# Patient Record
Sex: Male | Born: 1960 | Race: Black or African American | Hispanic: No | Marital: Single | Smoking: Never smoker
Health system: Southern US, Community
[De-identification: ages and names within clinical notes are randomized; demographics above are authoritative.]

## PROBLEM LIST (undated history)

## (undated) DIAGNOSIS — J45909 Unspecified asthma, uncomplicated: Secondary | ICD-10-CM

---

## 2017-10-02 ENCOUNTER — Emergency Department (HOSPITAL_COMMUNITY): Payer: Self-pay

## 2017-10-02 ENCOUNTER — Emergency Department (HOSPITAL_COMMUNITY)
Admission: EM | Admit: 2017-10-02 | Discharge: 2017-10-02 | Disposition: A | Discharge status: Home / Self Care | Payer: Self-pay | Attending: Emergency Medicine | Admitting: Emergency Medicine

## 2017-10-02 ENCOUNTER — Encounter (HOSPITAL_COMMUNITY): Payer: Self-pay

## 2017-10-02 DIAGNOSIS — R0789 Other chest pain: Secondary | ICD-10-CM | POA: Insufficient documentation

## 2017-10-02 DIAGNOSIS — R072 Precordial pain: Secondary | ICD-10-CM | POA: Insufficient documentation

## 2017-10-02 LAB — I-STAT TROPONIN, ED
TROPONIN I, POC: 0.01 ng/mL (ref 0.00–0.08)
Troponin i, poc: 0.01 ng/mL (ref 0.00–0.08)

## 2017-10-02 LAB — CBC
HEMATOCRIT: 44.7 % (ref 39.0–52.0)
HEMOGLOBIN: 15 g/dL (ref 13.0–17.0)
MCH: 29.2 pg (ref 26.0–34.0)
MCHC: 33.6 g/dL (ref 30.0–36.0)
MCV: 87 fL (ref 78.0–100.0)
Platelets: 287 10*3/uL (ref 150–400)
RBC: 5.14 MIL/uL (ref 4.22–5.81)
RDW: 13.6 % (ref 11.5–15.5)
WBC: 5.3 10*3/uL (ref 4.0–10.5)

## 2017-10-02 LAB — BASIC METABOLIC PANEL
ANION GAP: 8 (ref 5–15)
BUN: 16 mg/dL (ref 6–20)
CALCIUM: 9.6 mg/dL (ref 8.9–10.3)
CO2: 26 mmol/L (ref 22–32)
Chloride: 102 mmol/L (ref 101–111)
Creatinine, Ser: 1.1 mg/dL (ref 0.61–1.24)
GLUCOSE: 105 mg/dL — AB (ref 65–99)
POTASSIUM: 4.7 mmol/L (ref 3.5–5.1)
Sodium: 136 mmol/L (ref 135–145)

## 2017-10-02 MED ORDER — ACETAMINOPHEN 500 MG PO TABS
1000.0000 mg | ORAL_TABLET | Freq: Once | ORAL | Status: AC
  Administered 2017-10-02: 1000 mg via ORAL
  Filled 2017-10-02: qty 2

## 2017-10-02 NOTE — ED Provider Notes (Signed)
[facility]-EMERGENCY DEPT Provider Note   CSN: [id number] Arrival date & time: 10/02/17  4098     History   Chief Complaint Chief Complaint  Patient presents with  . Chest Pain    HPI Hector Black is a 57 y.o. male.  Patient c/o left chest pain since yesterday. States he bent over, and had sudden sharp left chest pain. Pain persistent since. Mild, non radiating. No specific exacerbating or alleviating factors. Denies exertional cp or discomfort. No current or recent cough or uri symptoms. No recent fevers. Denies leg pain or swelling. Denies fam hx premature cad. Denies hx pe or dvt.  No pleuritic pain. No sob. No associated nv or diaphoresis.   The history is provided by the patient.  Chest Pain   Pertinent negatives include no abdominal pain, no fever, no headaches and no shortness of breath.    History reviewed. No pertinent past medical history.  There are no active problems to display for this patient.   History reviewed. No pertinent surgical history.      Home Medications    Prior to Admission medications   Medication Sig Start Date End Date Taking? Authorizing Provider  albuterol (PROVENTIL HFA;VENTOLIN HFA) 108 (90 Base) MCG/ACT inhaler Inhale 2 puffs into the lungs every 6 (six) hours as needed for wheezing or shortness of breath.   Yes [provider]    Family History History reviewed. No pertinent family history.  Social History Social History   Tobacco Use  . Smoking status: Never Smoker  . Smokeless tobacco: Never Used  Substance Use Topics  . Alcohol use: Never    Frequency: Never  . Drug use: Never     Allergies   Patient has no known allergies.   Review of Systems Review of Systems  Constitutional: Negative for fever.  HENT: Negative for sore throat.   Eyes: Negative for redness.  Respiratory: Negative for shortness of breath.   Cardiovascular: Positive for chest pain. Negative for leg swelling.    Gastrointestinal: Negative for abdominal pain.  Genitourinary: Negative for flank pain.  Musculoskeletal: Negative for neck pain.  Skin: Negative for rash.  Neurological: Negative for headaches.  Hematological: Does not bruise/bleed easily.  Psychiatric/Behavioral: Negative for confusion.     Physical Exam Updated Vital Signs BP (!) 141/91 (BP Location: Right Arm)   Pulse 89   Temp 98 F (36.7 C) (Oral)   Resp 12   SpO2 100%   Physical Exam  Constitutional: He appears well-developed and well-nourished. No distress.  HENT:  Mouth/Throat: Oropharynx is clear and moist.  Eyes: Conjunctivae are normal.  Neck: Neck supple. No tracheal deviation present.  Cardiovascular: Normal rate, regular rhythm, normal heart sounds and intact distal pulses. Exam reveals no gallop and no friction rub.  No murmur heard. Pulmonary/Chest: Effort normal and breath sounds normal. No accessory muscle usage. No respiratory distress. He exhibits tenderness.  Abdominal: Soft. Bowel sounds are normal. He exhibits no distension. There is no tenderness.  Musculoskeletal: He exhibits no edema or tenderness.  Neurological: He is alert.  Skin: Skin is warm and dry. No rash noted. He is not diaphoretic.  Psychiatric: He has a normal mood and affect.  Nursing note and vitals reviewed.    ED Treatments / Results  Labs (all labs ordered are listed, but only abnormal results are displayed) Results for orders placed or performed during the hospital encounter of 10/02/17  Basic metabolic panel  Result Value Ref Range   Sodium  136 135 - 145 mmol/L   Potassium 4.7 3.5 - 5.1 mmol/L   Chloride 102 101 - 111 mmol/L   CO2 26 22 - 32 mmol/L   Glucose, Bld 105 (H) 65 - 99 mg/dL   BUN 16 6 - 20 mg/dL   Creatinine, Ser 1.61 0.61 - 1.24 mg/dL   Calcium 9.6 8.9 - 09.6 mg/dL   GFR calc non Af Amer >60 >60 mL/min   GFR calc Af Amer >60 >60 mL/min   Anion gap 8 5 - 15  CBC  Result Value Ref Range   WBC 5.3 4.0 -  10.5 K/uL   RBC 5.14 4.22 - 5.81 MIL/uL   Hemoglobin 15.0 13.0 - 17.0 g/dL   HCT 04.5 40.9 - 81.1 %   MCV 87.0 78.0 - 100.0 fL   MCH 29.2 26.0 - 34.0 pg   MCHC 33.6 30.0 - 36.0 g/dL   RDW 91.4 78.2 - 95.6 %   Platelets 287 150 - 400 K/uL  I-stat troponin, ED  Result Value Ref Range   Troponin i, poc 0.01 0.00 - 0.08 ng/mL   Comment 3          I-stat troponin, ED  Result Value Ref Range   Troponin i, poc 0.01 0.00 - 0.08 ng/mL   Comment 3           Dg Chest 2 View  Result Date: 10/02/2017 CLINICAL DATA:  Chest pain EXAM: CHEST - 2 VIEW COMPARISON:  None. FINDINGS: Lungs are clear. Heart size and pulmonary vascularity are normal. No adenopathy. No pneumothorax. No bone lesions. IMPRESSION: No edema or consolidation. Electronically Signed   By: Bretta Bang III M.D.   On: 10/02/2017 08:02    EKG EKG Interpretation  Date/Time:  Tuesday October 02 2017 06:56:33 EDT Ventricular Rate:  88 PR Interval:    QRS Duration: 91 QT Interval:  345 QTC Calculation: 418 R Axis:   82 Text Interpretation:  Sinus rhythm Nonspecific ST abnormality No previous tracing Confirmed by Cathren Laine ([id number]) on 10/02/2017 9:31:45 AM   Radiology Dg Chest 2 View  Result Date: 10/02/2017 CLINICAL DATA:  Chest pain EXAM: CHEST - 2 VIEW COMPARISON:  None. FINDINGS: Lungs are clear. Heart size and pulmonary vascularity are normal. No adenopathy. No pneumothorax. No bone lesions. IMPRESSION: No edema or consolidation. Electronically Signed   By: Bretta Bang III M.D.   On: 10/02/2017 08:02    Procedures Procedures (including critical care time)  Medications Ordered in ED Medications  acetaminophen (TYLENOL) tablet 1,000 mg (1,000 mg Oral Given 10/02/17 0948)     Initial Impression / Assessment and Plan / ED Course  I have reviewed the triage vital signs and the nursing notes.  Pertinent labs & imaging results that were available during my care of the patient were reviewed by me and considered  in my medical decision making (see chart for details).  Labs. Cxr. Ecg.  Reviewed nursing notes and prior charts for additional history.   Labs reviewed - troponin is normal.  cxr reviewed - no pna.   Motrin po.  Delta troponin is normal.   Recheck pt. No current cp or sob.   Pt currently appears stable for d/c.      Final Clinical Impressions(s) / ED Diagnoses   Final diagnoses:  None    ED Discharge Orders    None       Cathren Laine, MD 10/02/17 1155

## 2017-10-02 NOTE — ED Triage Notes (Signed)
Pt complains of left sided chest pain for one week, he thinks he may have over exerted himself at work because the pain would come and go Last night he was bending over and the pain was very sharp and continuous

## 2017-10-02 NOTE — Discharge Instructions (Addendum)
It was our pleasure to provide your ER care today - we hope that you feel better.  Take motrin or aleve as need for pain.   Follow up with primary care doctor in the next 1-2 weeks - call to arrange appointment.   For recent chest pain, follow up with cardiologist in the next couple weeks.  Return to ER if worse, new symptoms, fevers, increased trouble breathing, recurrent or persistent chest pain, other concern.

## 2019-08-19 ENCOUNTER — Emergency Department (HOSPITAL_COMMUNITY)
Admission: EM | Admit: 2019-08-19 | Discharge: 2019-08-19 | Disposition: A | Discharge status: Home / Self Care | Payer: Self-pay | Attending: Emergency Medicine | Admitting: Emergency Medicine

## 2019-08-19 ENCOUNTER — Other Ambulatory Visit: Payer: Self-pay

## 2019-08-19 ENCOUNTER — Emergency Department (HOSPITAL_COMMUNITY): Payer: Self-pay

## 2019-08-19 ENCOUNTER — Encounter (HOSPITAL_COMMUNITY): Payer: Self-pay | Admitting: Emergency Medicine

## 2019-08-19 DIAGNOSIS — R140 Abdominal distension (gaseous): Secondary | ICD-10-CM | POA: Insufficient documentation

## 2019-08-19 DIAGNOSIS — J45909 Unspecified asthma, uncomplicated: Secondary | ICD-10-CM | POA: Insufficient documentation

## 2019-08-19 DIAGNOSIS — R1012 Left upper quadrant pain: Secondary | ICD-10-CM | POA: Insufficient documentation

## 2019-08-19 DIAGNOSIS — R109 Unspecified abdominal pain: Secondary | ICD-10-CM

## 2019-08-19 DIAGNOSIS — R1013 Epigastric pain: Secondary | ICD-10-CM | POA: Insufficient documentation

## 2019-08-19 DIAGNOSIS — R1110 Vomiting, unspecified: Secondary | ICD-10-CM | POA: Insufficient documentation

## 2019-08-19 HISTORY — DX: Unspecified asthma, uncomplicated: J45.909

## 2019-08-19 LAB — COMPREHENSIVE METABOLIC PANEL
ALT: 20 U/L (ref 0–44)
AST: 22 U/L (ref 15–41)
Albumin: 3.9 g/dL (ref 3.5–5.0)
Alkaline Phosphatase: 64 U/L (ref 38–126)
Anion gap: 5 (ref 5–15)
BUN: 15 mg/dL (ref 6–20)
CO2: 30 mmol/L (ref 22–32)
Calcium: 9 mg/dL (ref 8.9–10.3)
Chloride: 102 mmol/L (ref 98–111)
Creatinine, Ser: 1.02 mg/dL (ref 0.61–1.24)
GFR calc Af Amer: >60 mL/min (ref >60)
GFR calc non Af Amer: >60 mL/min (ref >60)
Glucose, Bld: 117 mg/dL — ABNORMAL HIGH (ref 70–99)
Potassium: 3.9 mmol/L (ref 3.5–5.1)
Sodium: 137 mmol/L (ref 135–145)
Total Bilirubin: 0.4 mg/dL (ref 0.3–1.2)
Total Protein: 7.1 g/dL (ref 6.5–8.1)

## 2019-08-19 LAB — URINALYSIS, ROUTINE W REFLEX MICROSCOPIC
Bilirubin Urine: NEGATIVE
Glucose, UA: NEGATIVE mg/dL
Hgb urine dipstick: NEGATIVE
Ketones, ur: NEGATIVE mg/dL
Leukocytes,Ua: NEGATIVE
Nitrite: NEGATIVE
Protein, ur: NEGATIVE mg/dL
Specific Gravity, Urine: 1.031 — ABNORMAL HIGH (ref 1.005–1.030)
pH: 5 (ref 5.0–8.0)

## 2019-08-19 LAB — CBC
HCT: 42.7 % (ref 39.0–52.0)
Hemoglobin: 13.7 g/dL (ref 13.0–17.0)
MCH: 29.1 pg (ref 26.0–34.0)
MCHC: 32.1 g/dL (ref 30.0–36.0)
MCV: 90.9 fL (ref 80.0–100.0)
Platelets: 249 10*3/uL (ref 150–400)
RBC: 4.7 MIL/uL (ref 4.22–5.81)
RDW: 13.1 % (ref 11.5–15.5)
WBC: 6.9 10*3/uL (ref 4.0–10.5)
nRBC: 0 % (ref 0.0–0.2)

## 2019-08-19 LAB — LIPASE, BLOOD: Lipase: 24 U/L (ref 11–51)

## 2019-08-19 MED ORDER — SUCRALFATE 1 G PO TABS: 1.0000 g | ORAL_TABLET | Freq: Four times a day (QID) | ORAL | 0 refills | Status: DC

## 2019-08-19 MED ORDER — PANTOPRAZOLE SODIUM 20 MG PO TBEC: 20.0000 mg | DELAYED_RELEASE_TABLET | Freq: Two times a day (BID) | ORAL | 0 refills | Status: DC

## 2019-08-19 MED ORDER — IOHEXOL 300 MG/ML  SOLN
100.0000 mL | Freq: Once | INTRAMUSCULAR | Status: AC | PRN
  Administered 2019-08-19: 100 mL via INTRAVENOUS

## 2019-08-19 MED ORDER — SODIUM CHLORIDE (PF) 0.9 % IJ SOLN
INTRAMUSCULAR | Status: AC
  Filled 2019-08-19: qty 50

## 2019-08-19 NOTE — ED Provider Notes (Signed)
[facility] DEPT Provider Note   CSN: [id number] Arrival date & time: 08/19/19  1530     History Chief Complaint  Patient presents with  . Abdominal Pain    Hector Black is a 59 y.o. male.  59 year old male presents with 1 month of recurrent left upper quadrant as well as epigastric abdominal pain. Symptoms become worse after he eats especially sweet food. Has had nonbilious emesis but no diarrhea. No weight loss. Has had some abdominal distention. Has a history of tobacco use but denies any alcohol use. Has been using antacids with temporary relief.        Past Medical History:  Diagnosis Date  . Asthma     There are no problems to display for this patient.   History reviewed. No pertinent surgical history.     No family history on file.  Social History   Tobacco Use  . Smoking status: Never Smoker  . Smokeless tobacco: Never Used  Substance Use Topics  . Alcohol use: Never  . Drug use: Never    Home Medications Prior to Admission medications   Medication Sig Start Date End Date Taking? Authorizing Provider  alum & mag hydroxide-simeth (MAALOX/MYLANTA) 200-200-20 MG/5ML suspension Take 15 mLs by mouth every 6 (six) hours as needed for indigestion or heartburn.   Yes [provider]  simethicone (MYLICON) 80 MG chewable tablet Chew 80 mg by mouth every 6 (six) hours as needed for flatulence.   Yes [provider]    Allergies    Patient has no known allergies.  Review of Systems   Review of Systems  All other systems reviewed and are negative.   Physical Exam Updated Vital Signs BP 138/79 (BP Location: Left Arm)   Pulse 75   Temp 98 F (36.7 C) (Oral)   Resp 19   SpO2 99%   Physical Exam Vitals and nursing note reviewed.  Constitutional:      General: He is not in acute distress.    Appearance: Normal appearance. He is well-developed. He is not toxic-appearing.  HENT:     Head:  Normocephalic and atraumatic.  Eyes:     General: Lids are normal.     Conjunctiva/sclera: Conjunctivae normal.     Pupils: Pupils are equal, round, and reactive to light.  Neck:     Thyroid: No thyroid mass.     Trachea: No tracheal deviation.  Cardiovascular:     Rate and Rhythm: Normal rate and regular rhythm.     Heart sounds: Normal heart sounds. No murmur. No gallop.   Pulmonary:     Effort: Pulmonary effort is normal. No respiratory distress.     Breath sounds: Normal breath sounds. No stridor. No decreased breath sounds, wheezing, rhonchi or rales.  Abdominal:     General: Bowel sounds are normal. There is no distension.     Palpations: Abdomen is soft.     Tenderness: There is abdominal tenderness in the epigastric area and left upper quadrant. There is no guarding or rebound.    Musculoskeletal:        General: No tenderness. Normal range of motion.     Cervical back: Normal range of motion and neck supple.  Skin:    General: Skin is warm and dry.     Findings: No abrasion or rash.  Neurological:     Mental Status: He is alert and oriented to person, place, and time.     GCS: GCS eye subscore  is 4. GCS verbal subscore is 5. GCS motor subscore is 6.     Cranial Nerves: No cranial nerve deficit.     Sensory: No sensory deficit.  Psychiatric:        Speech: Speech normal.        Behavior: Behavior normal.     ED Results / Procedures / Treatments   Labs (all labs ordered are listed, but only abnormal results are displayed) Labs Reviewed  CBC  LIPASE, BLOOD  COMPREHENSIVE METABOLIC PANEL  URINALYSIS, ROUTINE W REFLEX MICROSCOPIC    EKG None  Radiology No results found.  Procedures Procedures (including critical care time)  Medications Ordered in ED Medications - No data to display  ED Course  I have reviewed the triage vital signs and the nursing notes.  Pertinent labs & imaging results that were available during my care of the patient were reviewed  by me and considered in my medical decision making (see chart for details).    MDM Rules/Calculators/A&P                      Donnell CT and labs are reassuring here.  Suspect peptic ulcer disease.  Will place on Carafate and PPI Final Clinical Impression(s) / ED Diagnoses Final diagnoses:  None    Rx / DC Orders ED Discharge Orders    None       Lorre Nick, MD 08/19/19 2040

## 2019-08-19 NOTE — ED Notes (Signed)
Pt attempting to provide a urine sample.

## 2019-08-19 NOTE — ED Triage Notes (Signed)
Pt c/o abd pains that started on Saturday. Reports more  On the right side and get worse after he eats.

## 2019-08-19 NOTE — ED Notes (Signed)
Pt has urinal at bedside and knows a urine sample is needed.

## 2019-08-19 NOTE — ED Notes (Signed)
Pt transported to CT ?

## 2019-09-25 ENCOUNTER — Emergency Department (HOSPITAL_COMMUNITY)
Admission: EM | Admit: 2019-09-25 | Discharge: 2019-09-25 | Disposition: A | Discharge status: Home / Self Care | Payer: Self-pay | Attending: Emergency Medicine | Admitting: Emergency Medicine

## 2019-09-25 ENCOUNTER — Encounter (HOSPITAL_COMMUNITY): Payer: Self-pay | Admitting: Emergency Medicine

## 2019-09-25 ENCOUNTER — Emergency Department (HOSPITAL_COMMUNITY): Payer: Self-pay

## 2019-09-25 ENCOUNTER — Other Ambulatory Visit: Payer: Self-pay

## 2019-09-25 DIAGNOSIS — J45909 Unspecified asthma, uncomplicated: Secondary | ICD-10-CM | POA: Insufficient documentation

## 2019-09-25 DIAGNOSIS — R1031 Right lower quadrant pain: Secondary | ICD-10-CM | POA: Insufficient documentation

## 2019-09-25 DIAGNOSIS — R1030 Lower abdominal pain, unspecified: Secondary | ICD-10-CM

## 2019-09-25 DIAGNOSIS — F17200 Nicotine dependence, unspecified, uncomplicated: Secondary | ICD-10-CM | POA: Insufficient documentation

## 2019-09-25 DIAGNOSIS — Z79899 Other long term (current) drug therapy: Secondary | ICD-10-CM | POA: Insufficient documentation

## 2019-09-25 LAB — CBC
HCT: 45.1 % (ref 39.0–52.0)
Hemoglobin: 14.6 g/dL (ref 13.0–17.0)
MCH: 29 pg (ref 26.0–34.0)
MCHC: 32.4 g/dL (ref 30.0–36.0)
MCV: 89.7 fL (ref 80.0–100.0)
Platelets: 274 10*3/uL (ref 150–400)
RBC: 5.03 MIL/uL (ref 4.22–5.81)
RDW: 13.1 % (ref 11.5–15.5)
WBC: 4.9 10*3/uL (ref 4.0–10.5)
nRBC: 0 % (ref 0.0–0.2)

## 2019-09-25 LAB — COMPREHENSIVE METABOLIC PANEL
ALT: 21 U/L (ref 0–44)
AST: 23 U/L (ref 15–41)
Albumin: 4 g/dL (ref 3.5–5.0)
Alkaline Phosphatase: 65 U/L (ref 38–126)
Anion gap: 8 (ref 5–15)
BUN: 12 mg/dL (ref 8–23)
CO2: 28 mmol/L (ref 22–32)
Calcium: 9.5 mg/dL (ref 8.9–10.3)
Chloride: 102 mmol/L (ref 98–111)
Creatinine, Ser: 0.93 mg/dL (ref 0.61–1.24)
GFR calc Af Amer: >60 mL/min (ref >60)
GFR calc non Af Amer: >60 mL/min (ref >60)
Glucose, Bld: 102 mg/dL — ABNORMAL HIGH (ref 70–99)
Potassium: 4.7 mmol/L (ref 3.5–5.1)
Sodium: 138 mmol/L (ref 135–145)
Total Bilirubin: 0.7 mg/dL (ref 0.3–1.2)
Total Protein: 7.4 g/dL (ref 6.5–8.1)

## 2019-09-25 LAB — URINALYSIS, ROUTINE W REFLEX MICROSCOPIC
Bilirubin Urine: NEGATIVE
Glucose, UA: NEGATIVE mg/dL
Hgb urine dipstick: NEGATIVE
Ketones, ur: NEGATIVE mg/dL
Leukocytes,Ua: NEGATIVE
Nitrite: NEGATIVE
Protein, ur: NEGATIVE mg/dL
Specific Gravity, Urine: 1.015 (ref 1.005–1.030)
pH: 7 (ref 5.0–8.0)

## 2019-09-25 LAB — TROPONIN I (HIGH SENSITIVITY)
Troponin I (High Sensitivity): 2 ng/L (ref <18)
Troponin I (High Sensitivity): <2 ng/L (ref <18)

## 2019-09-25 LAB — LIPASE, BLOOD: Lipase: 41 U/L (ref 11–51)

## 2019-09-25 MED ORDER — SODIUM CHLORIDE 0.9% FLUSH: 3.0000 mL | Freq: Once | INTRAVENOUS | Status: DC

## 2019-09-25 MED ORDER — DICYCLOMINE HCL 20 MG PO TABS: 20.0000 mg | ORAL_TABLET | Freq: Two times a day (BID) | ORAL | 0 refills | Status: DC | PRN

## 2019-09-25 MED ORDER — SODIUM CHLORIDE (PF) 0.9 % IJ SOLN
INTRAMUSCULAR | Status: AC
  Filled 2019-09-25: qty 50

## 2019-09-25 MED ORDER — IOHEXOL 300 MG/ML  SOLN
100.0000 mL | Freq: Once | INTRAMUSCULAR | Status: AC | PRN
  Administered 2019-09-25: 100 mL via INTRAVENOUS

## 2019-09-25 NOTE — ED Provider Notes (Signed)
[name]   CSN: [id number] Arrival date & time: 09/25/19  9485     History Chief Complaint  Patient presents with  . Abdominal Pain  . Chest Pain    Hector Black is a 59 y.o. male who presents emergency department chief complaint of abdominal pain. Patient c/o abdominal pain.  He states it comes and goes.  It feels like cramping.  It is worse in his lower abdomen.  He feels like it radiates all the way up to his abdomen and sometimes into his chest.  It seems to be worse with certain foods.  Improved after he takes Mylanta.  He is also taking Protonix and Carafate.  He is a daily smoker.  He drinks occasionally.  He is avoiding spicy foods and has modified his diet.  He is making regular bowel movements.  He denies urinary symptoms, flank pain, cough, shortness of breath, nausea, vomiting, constipation or diarrhea.  He has no previous surgical history.  HPI     Past Medical History:  Diagnosis Date  . Asthma     There are no problems to display for this patient.   History reviewed. No pertinent surgical history.     No family history on file.  Social History   Tobacco Use  . Smoking status: Never Smoker  . Smokeless tobacco: Never Used  Substance Use Topics  . Alcohol use: Never  . Drug use: Never    Home Medications Prior to Admission medications   Medication Sig Start Date End Date Taking? Authorizing Provider  alum & mag hydroxide-simeth (MAALOX/MYLANTA) 200-200-20 MG/5ML suspension Take 15 mLs by mouth every 6 (six) hours as needed for indigestion or heartburn.    [provider]  pantoprazole (PROTONIX) 20 MG tablet Take 1 tablet (20 mg total) by mouth 2 (two) times daily. 08/19/19   Lorre Nick, MD  simethicone (MYLICON) 80 MG chewable tablet Chew 80 mg by mouth every 6 (six) hours as needed for flatulence.    [provider]  sucralfate (CARAFATE) 1 g tablet Take 1 tablet (1 g total)  by mouth 4 (four) times daily. 08/19/19   Lorre Nick, MD    Allergies    Patient has no known allergies.  Review of Systems   Review of Systems Ten systems reviewed and are negative for acute change, except as noted in the HPI.   Physical Exam Updated Vital Signs BP (!) 130/94 (BP Location: Right Arm)   Pulse 75   Temp 97.9 F (36.6 C) (Oral)   Resp 17   SpO2 100%   Physical Exam Vitals and nursing note reviewed.  Constitutional:      General: He is not in acute distress.    Appearance: He is well-developed. He is not diaphoretic.  HENT:     Head: Normocephalic and atraumatic.  Eyes:     General: No scleral icterus.    Conjunctiva/sclera: Conjunctivae normal.  Cardiovascular:     Rate and Rhythm: Normal rate and regular rhythm.     Heart sounds: Normal heart sounds.  Pulmonary:     Effort: Pulmonary effort is normal. No respiratory distress.     Breath sounds: Normal breath sounds.  Abdominal:     Palpations: Abdomen is soft.     Tenderness: There is abdominal tenderness in the right lower quadrant, suprapubic area and left lower quadrant. There is no right CVA tenderness or left CVA tenderness.  Musculoskeletal:     Cervical back:  Normal range of motion and neck supple.  Skin:    General: Skin is warm and dry.  Neurological:     Mental Status: He is alert.  Psychiatric:        Behavior: Behavior normal.     ED Results / Procedures / Treatments   Labs (all labs ordered are listed, but only abnormal results are displayed) Labs Reviewed  COMPREHENSIVE METABOLIC PANEL - Abnormal; Notable for the following components:      Result Value   Glucose, Bld 102 (*)    All other components within normal limits  LIPASE, BLOOD  CBC  URINALYSIS, ROUTINE W REFLEX MICROSCOPIC  TROPONIN I (HIGH SENSITIVITY)  TROPONIN I (HIGH SENSITIVITY)    EKG EKG Interpretation  Date/Time:  Thursday September 25 2019 07:32:08 EDT Ventricular Rate:  74 PR Interval:    QRS  Duration: 76 QT Interval:  365 QTC Calculation: 405 R Axis:   74 Text Interpretation: Sinus rhythm Borderline prolonged PR interval Probable left atrial enlargement diffuse ST elevation, consider pericarditis or early repol no ischemic changes/reciprocal changes Confirmed by Pricilla Loveless [phone]) on 09/25/2019 8:56:19 AM   Radiology No results found.  Procedures Procedures (including critical care time)  Medications Ordered in ED Medications  sodium chloride flush (NS) 0.9 % injection 3 mL (has no administration in time range)    ED Course  I have reviewed the triage vital signs and the nursing notes.  Pertinent labs & imaging results that were available during my care of the patient were reviewed by me and considered in my medical decision making (see chart for details).    MDM Rules/Calculators/A&P                      UE:[id number] pain VS: BP (!) 147/103 (BP Location: Left Arm)   Pulse 62   Temp 97.9 F (36.6 C) (Oral)   Resp 18   SpO2 100%  JY:NWGNFAO is gathered by patient and EMR. Previous records obtained and reviewed. DDX:The patient's complaint of abdominal pain  involves an extensive number of diagnostic and treatment options, and is a complaint that carries with it a high risk of complications, morbidity, and potential mortality. Given the large differential diagnosis, medical decision making is of high complexity. The differential diagnosis for generalized abdominal pain includes, but is not limited to AAA, gastroenteritis, appendicitis, Bowel obstruction, Bowel perforation. Gastroparesis, DKA, Hernia, Inflammatory bowel disease, mesenteric ischemia, pancreatitis, peritonitis SBP, volvulus. Labs: I ordered reviewed and interpreted labs which include troponin which is negative, lipase which is also negative.  CBC without white count.  Urine without evidence of infection.  CMP shows mildly elevated glucose of insignificant value. Imaging: I ordered and reviewed images  which included CT abdomen pelvis with contrast. I independently visualized and interpreted all imaging. There are no acute or significant findings on today's images. EKG: Normal sinus rhythm at a rate of 74 Consults: N/A MDM: Patient here with abdominal pain.  I do believe this is likely secondary to gastritis versus ulcer however he was indicating he is having lower abdominal pain.  This may be secondary to cramping in the abdomen.  There does not appear to be a hernia, bowel wall inflammation or diverticulitis.  Patient is a encouraged to continue doing dietary modification, taking medications prescribed at his last visit, I have also ordered Bentyl for abdominal cramping.  He is advised to follow closely with gastroenterology.  Discussed return precautions. Patient disposition: Discharge The patient appears reasonably screened  and/or stabilized for discharge and I doubt any other medical condition or other [facility] requiring further screening, evaluation, or treatment in the ED at this time prior to discharge. I have discussed lab and/or imaging findings with the patient and answered all questions/concerns to the best of my ability.I have discussed return precautions and OP follow up.    Final Clinical Impression(s) / ED Diagnoses Final diagnoses:  None    Rx / DC Orders ED Discharge Orders    None       Margarita Mail, PA-C 09/25/19 1443    Sherwood Gambler, MD 09/26/19 1017

## 2019-09-25 NOTE — ED Triage Notes (Signed)
Pt reports had issues with stomach for months. Over the past week pains have moved up into his chest. Denies issues with n/v, urination or his bowels. Reports abd cramps.

## 2019-09-25 NOTE — Discharge Instructions (Addendum)
Your CT scan showed no abnormalities.  Please continue to make the dietary modifications you have already started.  Please continue taking the medications prescribed at your previous visit.  Abdominal (belly) pain can be caused by many things. Your caregiver performed an examination and possibly ordered blood/urine tests and imaging (CT scan, x-rays, ultrasound). Many cases can be observed and treated at home after initial evaluation in the emergency department. Even though you are being discharged home, abdominal pain can be unpredictable. Therefore, you need a repeated exam if your pain does not resolve, returns, or worsens. Most patients with abdominal pain don't have to be admitted to the hospital or have surgery, but serious problems like appendicitis and gallbladder attacks can start out as nonspecific pain. Many abdominal conditions cannot be diagnosed in one visit, so follow-up evaluations are very important. SEEK IMMEDIATE MEDICAL ATTENTION IF: The pain does not go away or becomes severe.  A temperature above 101 develops.  Repeated vomiting occurs (multiple episodes).  The pain becomes localized to portions of the abdomen. The right side could possibly be appendicitis. In an adult, the left lower portion of the abdomen could be colitis or diverticulitis.  Blood is being passed in stools or vomit (bright red or black tarry stools).  Return also if you develop chest pain, difficulty breathing, dizziness or fainting, or become confused, poorly responsive, or inconsolable (young children).

## 2020-03-08 ENCOUNTER — Emergency Department (HOSPITAL_COMMUNITY): Payer: Self-pay

## 2020-03-08 ENCOUNTER — Other Ambulatory Visit: Payer: Self-pay

## 2020-03-08 ENCOUNTER — Encounter (HOSPITAL_COMMUNITY): Payer: Self-pay | Admitting: *Deleted

## 2020-03-08 ENCOUNTER — Emergency Department (HOSPITAL_COMMUNITY)
Admission: EM | Admit: 2020-03-08 | Discharge: 2020-03-08 | Disposition: A | Discharge status: Home / Self Care | Payer: Self-pay | Attending: Emergency Medicine | Admitting: Emergency Medicine

## 2020-03-08 DIAGNOSIS — J45909 Unspecified asthma, uncomplicated: Secondary | ICD-10-CM | POA: Insufficient documentation

## 2020-03-08 DIAGNOSIS — R1012 Left upper quadrant pain: Secondary | ICD-10-CM | POA: Insufficient documentation

## 2020-03-08 LAB — URINALYSIS, ROUTINE W REFLEX MICROSCOPIC
Bilirubin Urine: NEGATIVE
Glucose, UA: NEGATIVE mg/dL
Hgb urine dipstick: NEGATIVE
Ketones, ur: NEGATIVE mg/dL
Leukocytes,Ua: NEGATIVE
Nitrite: NEGATIVE
Protein, ur: NEGATIVE mg/dL
Specific Gravity, Urine: 1.015 (ref 1.005–1.030)
pH: 6 (ref 5.0–8.0)

## 2020-03-08 LAB — COMPREHENSIVE METABOLIC PANEL
ALT: 15 U/L (ref 0–44)
AST: 20 U/L (ref 15–41)
Albumin: 4.5 g/dL (ref 3.5–5.0)
Alkaline Phosphatase: 52 U/L (ref 38–126)
Anion gap: 6 (ref 5–15)
BUN: 11 mg/dL (ref 8–23)
CO2: 30 mmol/L (ref 22–32)
Calcium: 9.2 mg/dL (ref 8.9–10.3)
Chloride: 102 mmol/L (ref 98–111)
Creatinine, Ser: 0.94 mg/dL (ref 0.61–1.24)
GFR calc Af Amer: >60 mL/min (ref >60)
GFR calc non Af Amer: >60 mL/min (ref >60)
Glucose, Bld: 97 mg/dL (ref 70–99)
Potassium: 4.2 mmol/L (ref 3.5–5.1)
Sodium: 138 mmol/L (ref 135–145)
Total Bilirubin: 0.7 mg/dL (ref 0.3–1.2)
Total Protein: 7.1 g/dL (ref 6.5–8.1)

## 2020-03-08 LAB — CBC
HCT: 39.4 % (ref 39.0–52.0)
Hemoglobin: 13 g/dL (ref 13.0–17.0)
MCH: 28.8 pg (ref 26.0–34.0)
MCHC: 33 g/dL (ref 30.0–36.0)
MCV: 87.2 fL (ref 80.0–100.0)
Platelets: 235 10*3/uL (ref 150–400)
RBC: 4.52 MIL/uL (ref 4.22–5.81)
RDW: 13.2 % (ref 11.5–15.5)
WBC: 5 10*3/uL (ref 4.0–10.5)
nRBC: 0 % (ref 0.0–0.2)

## 2020-03-08 LAB — LIPASE, BLOOD: Lipase: 22 U/L (ref 11–51)

## 2020-03-08 MED ORDER — FAMOTIDINE IN NACL 20-0.9 MG/50ML-% IV SOLN
20.0000 mg | Freq: Once | INTRAVENOUS | Status: AC
  Administered 2020-03-08: 20 mg via INTRAVENOUS
  Filled 2020-03-08: qty 50

## 2020-03-08 MED ORDER — LIDOCAINE VISCOUS HCL 2 % MT SOLN
15.0000 mL | Freq: Once | OROMUCOSAL | Status: AC
  Administered 2020-03-08: 15 mL via ORAL
  Filled 2020-03-08: qty 15

## 2020-03-08 MED ORDER — IOHEXOL 300 MG/ML  SOLN
100.0000 mL | Freq: Once | INTRAMUSCULAR | Status: AC | PRN
  Administered 2020-03-08: 100 mL via INTRAVENOUS

## 2020-03-08 MED ORDER — ALUM & MAG HYDROXIDE-SIMETH 200-200-20 MG/5ML PO SUSP
30.0000 mL | Freq: Once | ORAL | Status: AC
  Administered 2020-03-08: 30 mL via ORAL
  Filled 2020-03-08: qty 30

## 2020-03-08 MED ORDER — FAMOTIDINE 20 MG PO TABS: 20.0000 mg | ORAL_TABLET | Freq: Two times a day (BID) | ORAL | 0 refills | Status: DC

## 2020-03-08 NOTE — ED Provider Notes (Signed)
[facility]-EMERGENCY DEPT Provider Note   CSN: [id number] Arrival date & time: 03/08/20  1423     History Chief Complaint  Patient presents with  . Abdominal Pain    Hector Black is a 58 y.o. male.  59 year old male with prior medical history detailed below presents for evaluation of left upper quadrant abdominal discomfort.  Patient reports onset of left upper quadrant abdominal pain 4 days prior.  He complains of continued discomfort.  Pain is not associated with nausea, vomiting, fever, other abdominal pain, bowel movement change, or other complaint.  Of note, patient does have an already scheduled screening colonoscopy with [facility] on October 22 of this month.  He did not contact [facility] today.  He decided to come to the ED for evaluation.  He denies vomiting.  He denies significant constipation or diarrhea.  He denies bloody stools.  He denies excessive ETOH use.  He does report regular use of aspirin, Motrin, and Tylenol.  The history is provided by the patient.  Abdominal Pain Pain location:  LUQ Pain quality: aching and cramping   Pain radiates to:  Does not radiate Pain severity:  Mild Onset quality:  Gradual Duration:  4 days Timing:  Constant Progression:  Unchanged Chronicity:  New Relieved by:  Nothing Worsened by:  Nothing      Past Medical History:  Diagnosis Date  . Asthma     There are no problems to display for this patient.   History reviewed. No pertinent surgical history.     No family history on file.  Social History   Tobacco Use  . Smoking status: Never Smoker  . Smokeless tobacco: Never Used  Vaping Use  . Vaping Use: Never used  Substance Use Topics  . Alcohol use: Never  . Drug use: Never    Home Medications Prior to Admission medications   Medication Sig Start Date End Date Taking? Authorizing Provider  alum & mag hydroxide-simeth (MAALOX/MYLANTA) 200-200-20 MG/5ML suspension Take 15 mLs by mouth  every 6 (six) hours as needed for indigestion or heartburn.    [provider]  dicyclomine (BENTYL) 20 MG tablet Take 1 tablet (20 mg total) by mouth 2 (two) times daily as needed (abdominal pain and cramping). 09/25/19   Harris, Abigail, PA-C  mometasone (NASONEX) 50 MCG/ACT nasal spray Place 1-2 sprays into the nose 2 (two) times daily as needed (allergies).    [provider]  pantoprazole (PROTONIX) 20 MG tablet Take 1 tablet (20 mg total) by mouth 2 (two) times daily. 08/19/19   Lorre Nick, MD  simethicone (MYLICON) 80 MG chewable tablet Chew 80 mg by mouth every 6 (six) hours as needed for flatulence.    [provider]  sucralfate (CARAFATE) 1 g tablet Take 1 tablet (1 g total) by mouth 4 (four) times daily. Patient not taking: Reported on 09/25/2019 08/19/19   Lorre Nick, MD    Allergies    Patient has no known allergies.  Review of Systems   Review of Systems  Gastrointestinal: Positive for abdominal pain.  All other systems reviewed and are negative.   Physical Exam Updated Vital Signs BP (!) 146/89 (BP Location: Left Arm)   Pulse 82   Temp 98.4 F (36.9 C) (Oral)   Resp 18   Ht 6\' 2"  (1.88 m)   Wt 84.1 kg   SpO2 100%   BMI 23.80 kg/m   Physical Exam Vitals and nursing note reviewed.  Constitutional:  General: He is not in acute distress.    Appearance: He is well-developed.  HENT:     Head: Normocephalic and atraumatic.  Eyes:     Conjunctiva/sclera: Conjunctivae normal.     Pupils: Pupils are equal, round, and reactive to light.  Cardiovascular:     Rate and Rhythm: Normal rate and regular rhythm.     Heart sounds: Normal heart sounds.  Pulmonary:     Effort: Pulmonary effort is normal. No respiratory distress.     Breath sounds: Normal breath sounds.  Abdominal:     General: There is no distension.     Palpations: Abdomen is soft.     Tenderness: There is abdominal tenderness in the left upper quadrant.    Musculoskeletal:        General: No deformity. Normal range of motion.     Cervical back: Normal range of motion and neck supple.  Skin:    General: Skin is warm and dry.  Neurological:     Mental Status: He is alert and oriented to person, place, and time.     ED Results / Procedures / Treatments   Labs (all labs ordered are listed, but only abnormal results are displayed) Labs Reviewed  LIPASE, BLOOD  COMPREHENSIVE METABOLIC PANEL  CBC  URINALYSIS, ROUTINE W REFLEX MICROSCOPIC    EKG None  Radiology No results found.  Procedures Procedures (including critical care time)  Medications Ordered in ED Medications  alum & mag hydroxide-simeth (MAALOX/MYLANTA) 200-200-20 MG/5ML suspension 30 mL (has no administration in time range)    And  lidocaine (XYLOCAINE) 2 % viscous mouth solution 15 mL (has no administration in time range)  famotidine (PEPCID) IVPB 20 mg premix (has no administration in time range)    ED Course  I have reviewed the triage vital signs and the nursing notes.  Pertinent labs & imaging results that were available during my care of the patient were reviewed by me and considered in my medical decision making (see chart for details).    MDM Rules/Calculators/A&P                          MDM  Screen complete  Hector Black was evaluated in Emergency Department on 03/08/2020 for the symptoms described in the history of present illness. He was evaluated in the context of the global COVID-19 pandemic, which necessitated consideration that the patient might be at risk for infection with the SARS-CoV-2 virus that causes COVID-19. Institutional protocols and algorithms that pertain to the evaluation of patients at risk for COVID-19 are in a state of rapid change based on information released by regulatory bodies including the CDC and federal and state organizations. These policies and algorithms were followed during the patient's care in the ED.  Patient  is presenting for evaluation of reported abdominal discomfort.  Described symptoms are most consistent with likely GERD versus gastritis.  Patient's ED work-up did not reveal significant acute abnormalities.  CT imaging also did not reveal significant acute abnormality.  Patient does have already established GI care with [facility].  Patient has previously scheduled colonoscopy for later this month on the 22nd.  Patient feels significantly improved following his ED evaluation tonight.  He does understand need for close follow-up with his GI providers at [facility].  Strict return precautions given and understood.   Final Clinical Impression(s) / ED Diagnoses Final diagnoses:  Left upper quadrant abdominal pain    Rx / DC  Orders ED Discharge Orders         Ordered    famotidine (PEPCID) 20 MG tablet  2 times daily        03/08/20 Leilani Able, MD 03/08/20 1824

## 2020-03-08 NOTE — Discharge Instructions (Signed)
Please return for any problem.  Follow-up with your gastroenterology provider at [facility] tomorrow.

## 2020-03-08 NOTE — ED Triage Notes (Signed)
Pt reports lower abd pain that began last Wednesday / Thursday. Pt denies n/v/d.  Pt last BM was this morning.

## 2020-09-21 ENCOUNTER — Encounter (HOSPITAL_COMMUNITY): Payer: Self-pay | Admitting: Emergency Medicine

## 2020-09-21 ENCOUNTER — Emergency Department (HOSPITAL_COMMUNITY): Payer: Self-pay

## 2020-09-21 ENCOUNTER — Emergency Department (HOSPITAL_COMMUNITY)
Admission: EM | Admit: 2020-09-21 | Discharge: 2020-09-21 | Disposition: A | Discharge status: Home / Self Care | Payer: Self-pay | Attending: Emergency Medicine | Admitting: Emergency Medicine

## 2020-09-21 DIAGNOSIS — R1110 Vomiting, unspecified: Secondary | ICD-10-CM

## 2020-09-21 DIAGNOSIS — Z20822 Contact with and (suspected) exposure to covid-19: Secondary | ICD-10-CM | POA: Insufficient documentation

## 2020-09-21 DIAGNOSIS — R1084 Generalized abdominal pain: Secondary | ICD-10-CM | POA: Insufficient documentation

## 2020-09-21 DIAGNOSIS — J45909 Unspecified asthma, uncomplicated: Secondary | ICD-10-CM | POA: Insufficient documentation

## 2020-09-21 DIAGNOSIS — R112 Nausea with vomiting, unspecified: Secondary | ICD-10-CM | POA: Insufficient documentation

## 2020-09-21 DIAGNOSIS — R197 Diarrhea, unspecified: Secondary | ICD-10-CM | POA: Insufficient documentation

## 2020-09-21 LAB — RESP PANEL BY RT-PCR (FLU A&B, COVID) ARPGX2
Influenza A by PCR: NEGATIVE
Influenza B by PCR: NEGATIVE
SARS Coronavirus 2 by RT PCR: NEGATIVE

## 2020-09-21 LAB — URINALYSIS, ROUTINE W REFLEX MICROSCOPIC
Bilirubin Urine: NEGATIVE
Glucose, UA: NEGATIVE mg/dL
Hgb urine dipstick: NEGATIVE
Ketones, ur: NEGATIVE mg/dL
Leukocytes,Ua: NEGATIVE
Nitrite: NEGATIVE
Protein, ur: NEGATIVE mg/dL
Specific Gravity, Urine: 1.023 (ref 1.005–1.030)
pH: 8 (ref 5.0–8.0)

## 2020-09-21 LAB — COMPREHENSIVE METABOLIC PANEL
ALT: 16 U/L (ref 0–44)
AST: 23 U/L (ref 15–41)
Albumin: 4.2 g/dL (ref 3.5–5.0)
Alkaline Phosphatase: 65 U/L (ref 38–126)
Anion gap: 8 (ref 5–15)
BUN: 10 mg/dL (ref 8–23)
CO2: 27 mmol/L (ref 22–32)
Calcium: 9.7 mg/dL (ref 8.9–10.3)
Chloride: 105 mmol/L (ref 98–111)
Creatinine, Ser: 0.75 mg/dL (ref 0.61–1.24)
GFR, Estimated: >60 mL/min (ref >60)
Glucose, Bld: 93 mg/dL (ref 70–99)
Potassium: 4.2 mmol/L (ref 3.5–5.1)
Sodium: 140 mmol/L (ref 135–145)
Total Bilirubin: 0.5 mg/dL (ref 0.3–1.2)
Total Protein: 7.9 g/dL (ref 6.5–8.1)

## 2020-09-21 LAB — CBC
HCT: 44.6 % (ref 39.0–52.0)
Hemoglobin: 14.5 g/dL (ref 13.0–17.0)
MCH: 28.5 pg (ref 26.0–34.0)
MCHC: 32.5 g/dL (ref 30.0–36.0)
MCV: 87.6 fL (ref 80.0–100.0)
Platelets: 239 10*3/uL (ref 150–400)
RBC: 5.09 MIL/uL (ref 4.22–5.81)
RDW: 13.2 % (ref 11.5–15.5)
WBC: 5.4 10*3/uL (ref 4.0–10.5)
nRBC: 0 % (ref 0.0–0.2)

## 2020-09-21 LAB — LIPASE, BLOOD: Lipase: 53 U/L — ABNORMAL HIGH (ref 11–51)

## 2020-09-21 MED ORDER — OMEPRAZOLE MAGNESIUM 20 MG PO TBEC: 20.0000 mg | DELAYED_RELEASE_TABLET | Freq: Every day | ORAL | 1 refills | Status: AC

## 2020-09-21 MED ORDER — IOHEXOL 300 MG/ML  SOLN
100.0000 mL | Freq: Once | INTRAMUSCULAR | Status: AC | PRN
  Administered 2020-09-21: 100 mL via INTRAVENOUS

## 2020-09-21 MED ORDER — ONDANSETRON 4 MG PO TBDP: 4.0000 mg | ORAL_TABLET | Freq: Three times a day (TID) | ORAL | 0 refills | Status: AC | PRN

## 2020-09-21 NOTE — ED Provider Notes (Signed)
[facility]-EMERGENCY DEPT Provider Note   CSN: [id number] Arrival date & time: 09/21/20  1106     History Chief Complaint  Patient presents with  . Abdominal Pain  . Nausea  . Emesis    Hector Black is a 60 y.o. male.  The history is provided by the patient. No language interpreter was used.  Abdominal Pain Pain location:  Generalized Pain quality: aching   Pain radiates to:  Does not radiate Pain severity:  Moderate Onset quality:  Gradual Duration:  6 days Timing:  Constant Progression:  Worsening Chronicity:  New Relieved by:  Nothing Worsened by:  Nothing Ineffective treatments:  None tried Associated symptoms: nausea and vomiting   Emesis Associated symptoms: abdominal pain    Pt complains of abdominal pain, vomiting and diarrhea  Pt reports persistent nausea and abdominal pain      Past Medical History:  Diagnosis Date  . Asthma     There are no problems to display for this patient.   History reviewed. No pertinent surgical history.     No family history on file.  Social History   Tobacco Use  . Smoking status: Never Smoker  . Smokeless tobacco: Never Used  Vaping Use  . Vaping Use: Never used  Substance Use Topics  . Alcohol use: Never  . Drug use: Never    Home Medications Prior to Admission medications   Medication Sig Start Date End Date Taking? Authorizing Provider  alum & mag hydroxide-simeth (MAALOX/MYLANTA) 200-200-20 MG/5ML suspension Take 15 mLs by mouth every 6 (six) hours as needed for indigestion or heartburn.   Yes [provider]  mometasone (NASONEX) 50 MCG/ACT nasal spray Place 1-2 sprays into the nose 2 (two) times daily as needed (allergies).   Yes [provider]  Probiotic Product (DIGESTIVE ADV DIGESTIVE/IMMUNE) CAPS Take 1 capsule by mouth daily.   Yes [provider]  dicyclomine (BENTYL) 20 MG tablet Take 1 tablet (20 mg total) by mouth 2 (two) times daily as  needed (abdominal pain and cramping). Patient not taking: No sig reported 09/25/19   Arthor Captain, PA-C  famotidine (PEPCID) 20 MG tablet Take 1 tablet (20 mg total) by mouth 2 (two) times daily. Patient not taking: Reported on 09/21/2020 03/08/20   Wynetta Fines, MD  pantoprazole (PROTONIX) 20 MG tablet Take 1 tablet (20 mg total) by mouth 2 (two) times daily. Patient not taking: Reported on 09/21/2020 08/19/19   Lorre Nick, MD  sucralfate (CARAFATE) 1 g tablet Take 1 tablet (1 g total) by mouth 4 (four) times daily. Patient not taking: No sig reported 08/19/19   Lorre Nick, MD    Allergies    Patient has no known allergies.  Review of Systems   Review of Systems  Gastrointestinal: Positive for abdominal pain, nausea and vomiting.  All other systems reviewed and are negative.   Physical Exam Updated Vital Signs BP (!) 161/102   Pulse 70   Temp 98.5 F (36.9 C) (Oral)   Resp 20   SpO2 98%   Physical Exam Vitals and nursing note reviewed.  Constitutional:      Appearance: He is well-developed.  HENT:     Head: Normocephalic and atraumatic.  Eyes:     Conjunctiva/sclera: Conjunctivae normal.  Cardiovascular:     Rate and Rhythm: Normal rate and regular rhythm.     Heart sounds: No murmur heard.   Pulmonary:     Effort: Pulmonary effort is normal. No respiratory  distress.     Breath sounds: Normal breath sounds.  Abdominal:     General: Abdomen is flat. Bowel sounds are normal.     Palpations: Abdomen is soft.     Tenderness: There is generalized abdominal tenderness.  Musculoskeletal:     Cervical back: Neck supple.  Skin:    General: Skin is warm and dry.  Neurological:     General: No focal deficit present.     Mental Status: He is alert.     ED Results / Procedures / Treatments   Labs (all labs ordered are listed, but only abnormal results are displayed) Labs Reviewed  LIPASE, BLOOD - Abnormal; Notable for the following components:      Result  Value   Lipase 53 (*)    All other components within normal limits  RESP PANEL BY RT-PCR (FLU A&B, COVID) ARPGX2  COMPREHENSIVE METABOLIC PANEL  CBC  URINALYSIS, ROUTINE W REFLEX MICROSCOPIC    EKG None  Radiology CT ABDOMEN PELVIS W CONTRAST  Result Date: 09/21/2020 CLINICAL DATA:  Abdominal pain, nausea and vomiting since last Thursday. EXAM: CT ABDOMEN AND PELVIS WITH CONTRAST TECHNIQUE: Multidetector CT imaging of the abdomen and pelvis was performed using the standard protocol following bolus administration of intravenous contrast. CONTRAST:  OMNIPAQUE IOHEXOL 300 MG/ML  SOLN COMPARISON:  CT scan 03/08/2020 FINDINGS: Lower chest: The lung bases are clear of acute process. No pleural effusion or pulmonary lesions. The heart is normal in size. No pericardial effusion. The distal esophagus and aorta are unremarkable. Hepatobiliary: No focal hepatic lesions or intrahepatic biliary dilatation. The gallbladder is normal. No common bile duct dilatation. Pancreas: No mass, inflammation or ductal dilatation. Spleen: Normal size.  No focal lesions. Adrenals/Urinary Tract: Adrenal glands and kidneys are unremarkable. No renal lesions or hydronephrosis. No obstructing ureteral calculi bladder calculi. No bladder mass. Stomach/Bowel: The stomach, duodenum, small bowel and colon are grossly normal. No acute inflammatory changes, mass lesions or obstructive findings. The terminal ileum is normal. The appendix is not identified for certain but no findings to suggest acute appendicitis. Vascular/Lymphatic: Scattered distal aortic and iliac artery calcifications but no aneurysm or dissection. The branch vessels are patent. Major venous structures are patent. No mesenteric or retroperitoneal mass or adenopathy. Reproductive: Mild prostate gland enlargement with mild impression on the base the bladder. The seminal vesicles are unremarkable. Other: No pelvic mass or adenopathy. No free pelvic fluid collections.  No inguinal mass or adenopathy. No abdominal wall hernia or subcutaneous lesions. Musculoskeletal: No significant bony findings. Moderate degenerative disc disease noted at L5-S1. IMPRESSION: 1. No acute abdominal/pelvic findings, mass lesions or adenopathy. 2. Mild prostate gland enlargement with mild impression on the base the bladder. 3. Aortic atherosclerosis. Aortic Atherosclerosis (ICD10-I70.0). Electronically Signed   By: Rudie Meyer M.D.   On: 09/21/2020 13:37    Procedures Procedures   Medications Ordered in ED Medications  iohexol (OMNIPAQUE) 300 MG/ML solution 100 mL (100 mLs Intravenous Contrast Given 09/21/20 1302)    ED Course  I have reviewed the triage vital signs and the nursing notes.  Pertinent labs & imaging results that were available during my care of the patient were reviewed by me and considered in my medical decision making (see chart for details).    MDM Rules/Calculators/A&P                          MDM:  Ct abdomen shows enlarged prostate no acute abnormality.  Pt  advised to follow up with GI  Final Clinical Impression(s) / ED Diagnoses Final diagnoses:  Intractable vomiting, presence of nausea not specified, unspecified vomiting type    Rx / DC Orders ED Discharge Orders         Ordered    ondansetron (ZOFRAN ODT) 4 MG disintegrating tablet  Every 8 hours PRN        09/21/20 1600    omeprazole (PRILOSEC OTC) 20 MG tablet  Daily        09/21/20 1600        An After Visit Summary was printed and given to the patient.    Elson Areas, PA-C 09/21/20 1600    Koleen Distance, MD 09/24/20 [phone]

## 2020-09-21 NOTE — Discharge Instructions (Signed)
Follow up with Gi as scheduled 

## 2020-09-21 NOTE — ED Triage Notes (Signed)
Patient here from home reporting abd pain, n/v that started Thursday. Reports OTC meds with no relief.

## 2020-09-21 NOTE — ED Notes (Signed)
An After Visit Summary was printed and given to the patient. Discharge instructions given and no further questions at this time.  

## 2021-01-09 ENCOUNTER — Other Ambulatory Visit: Payer: Self-pay

## 2021-01-09 ENCOUNTER — Emergency Department (HOSPITAL_COMMUNITY)
Admission: EM | Admit: 2021-01-09 | Discharge: 2021-01-10 | Disposition: A | Discharge status: Home / Self Care | Payer: Self-pay | Attending: Emergency Medicine | Admitting: Emergency Medicine

## 2021-01-09 ENCOUNTER — Encounter (HOSPITAL_COMMUNITY): Payer: Self-pay | Admitting: Emergency Medicine

## 2021-01-09 DIAGNOSIS — J45909 Unspecified asthma, uncomplicated: Secondary | ICD-10-CM | POA: Insufficient documentation

## 2021-01-09 DIAGNOSIS — R109 Unspecified abdominal pain: Secondary | ICD-10-CM

## 2021-01-09 DIAGNOSIS — R1030 Lower abdominal pain, unspecified: Secondary | ICD-10-CM | POA: Insufficient documentation

## 2021-01-09 LAB — CBC WITH DIFFERENTIAL/PLATELET
Abs Immature Granulocytes: 0.01 10*3/uL (ref 0.00–0.07)
Basophils Absolute: 0 10*3/uL (ref 0.0–0.1)
Basophils Relative: 1 %
Eosinophils Absolute: 0.4 10*3/uL (ref 0.0–0.5)
Eosinophils Relative: 7 %
HCT: 41.1 % (ref 39.0–52.0)
Hemoglobin: 13.5 g/dL (ref 13.0–17.0)
Immature Granulocytes: 0 %
Lymphocytes Relative: 30 %
Lymphs Abs: 1.7 10*3/uL (ref 0.7–4.0)
MCH: 29 pg (ref 26.0–34.0)
MCHC: 32.8 g/dL (ref 30.0–36.0)
MCV: 88.2 fL (ref 80.0–100.0)
Monocytes Absolute: 0.6 10*3/uL (ref 0.1–1.0)
Monocytes Relative: 10 %
Neutro Abs: 3 10*3/uL (ref 1.7–7.7)
Neutrophils Relative %: 52 %
Platelets: 228 10*3/uL (ref 150–400)
RBC: 4.66 MIL/uL (ref 4.22–5.81)
RDW: 13.7 % (ref 11.5–15.5)
WBC: 5.7 10*3/uL (ref 4.0–10.5)
nRBC: 0 % (ref 0.0–0.2)

## 2021-01-09 LAB — COMPREHENSIVE METABOLIC PANEL
ALT: 19 U/L (ref 0–44)
AST: 23 U/L (ref 15–41)
Albumin: 4 g/dL (ref 3.5–5.0)
Alkaline Phosphatase: 58 U/L (ref 38–126)
Anion gap: 6 (ref 5–15)
BUN: 12 mg/dL (ref 8–23)
CO2: 29 mmol/L (ref 22–32)
Calcium: 9.2 mg/dL (ref 8.9–10.3)
Chloride: 101 mmol/L (ref 98–111)
Creatinine, Ser: 0.92 mg/dL (ref 0.61–1.24)
GFR, Estimated: >60 mL/min (ref >60)
Glucose, Bld: 103 mg/dL — ABNORMAL HIGH (ref 70–99)
Potassium: 4.1 mmol/L (ref 3.5–5.1)
Sodium: 136 mmol/L (ref 135–145)
Total Bilirubin: 0.4 mg/dL (ref 0.3–1.2)
Total Protein: 7.3 g/dL (ref 6.5–8.1)

## 2021-01-09 LAB — LIPASE, BLOOD: Lipase: 42 U/L (ref 11–51)

## 2021-01-09 NOTE — ED Notes (Signed)
Pt ambulatory in ED lobby. 

## 2021-01-09 NOTE — ED Triage Notes (Signed)
Pt c/o lower abdominal cramping x 2 months that's worsened over the past few days. Pt was seen in ED for same and prescribed omeprazole which he states hasn't worked. Normal BM and urinary habits. Cannot follow with GI due to lack of insurance. Denies NV, fevers.

## 2021-01-09 NOTE — ED Notes (Signed)
Pt provided urine specimen cup, pt advised he is unable to void at this time.

## 2021-01-10 LAB — URINALYSIS, ROUTINE W REFLEX MICROSCOPIC
Bilirubin Urine: NEGATIVE
Glucose, UA: NEGATIVE mg/dL
Hgb urine dipstick: NEGATIVE
Ketones, ur: NEGATIVE mg/dL
Leukocytes,Ua: NEGATIVE
Nitrite: NEGATIVE
Protein, ur: NEGATIVE mg/dL
Specific Gravity, Urine: 1.006 (ref 1.005–1.030)
pH: 6 (ref 5.0–8.0)

## 2021-01-10 NOTE — ED Provider Notes (Addendum)
[facility]-EMERGENCY DEPT Provider Note  CSN: [id number] Arrival date & time: 01/09/21 2252  Chief Complaint(s) Abdominal Pain  HPI Hector Black is a 60 y.o. male    Abdominal Pain Pain location: lower. Pain quality: cramping   Pain radiates to:  Back Pain severity:  Moderate Onset quality:  Gradual Duration: several months. Timing:  Sporadic Progression:  Waxing and waning Chronicity:  Recurrent Context: not alcohol use, not diet changes, not eating, not previous surgeries, not recent illness, not recent sexual activity, not retching, not sick contacts, not suspicious food intake and not trauma   Relieved by: self resolve after a few minutes. Worsened by:  Nothing Ineffective treatments:  Antacids Associated symptoms: no chest pain, no constipation, no diarrhea, no dysuria, no fatigue, no fever, no flatus and no nausea    No pain for the past day or two.  Past Medical History Past Medical History:  Diagnosis Date   Asthma    There are no problems to display for this patient.  Home Medication(s) Prior to Admission medications   Medication Sig Start Date End Date Taking? Authorizing Provider  alum & mag hydroxide-simeth (MAALOX/MYLANTA) 200-200-20 MG/5ML suspension Take 15 mLs by mouth every 6 (six) hours as needed for indigestion or heartburn.    [provider]  mometasone (NASONEX) 50 MCG/ACT nasal spray Place 1-2 sprays into the nose 2 (two) times daily as needed (allergies).    [provider]  omeprazole (PRILOSEC OTC) 20 MG tablet Take 1 tablet (20 mg total) by mouth daily. 09/21/20 09/21/21  Elson Areas, PA-C  ondansetron (ZOFRAN ODT) 4 MG disintegrating tablet Take 1 tablet (4 mg total) by mouth every 8 (eight) hours as needed for nausea or vomiting. 09/21/20   Elson Areas, PA-C  Probiotic Product (DIGESTIVE ADV DIGESTIVE/IMMUNE) CAPS Take 1 capsule by mouth daily.    [provider]  dicyclomine (BENTYL) 20  MG tablet Take 1 tablet (20 mg total) by mouth 2 (two) times daily as needed (abdominal pain and cramping). Patient not taking: No sig reported 09/25/19 09/21/20  Arthor Captain, PA-C  famotidine (PEPCID) 20 MG tablet Take 1 tablet (20 mg total) by mouth 2 (two) times daily. Patient not taking: Reported on 09/21/2020 03/08/20 09/21/20  Wynetta Fines, MD  pantoprazole (PROTONIX) 20 MG tablet Take 1 tablet (20 mg total) by mouth 2 (two) times daily. Patient not taking: Reported on 09/21/2020 08/19/19 09/21/20  Lorre Nick, MD  sucralfate (CARAFATE) 1 g tablet Take 1 tablet (1 g total) by mouth 4 (four) times daily. Patient not taking: No sig reported 08/19/19 09/21/20  Lorre Nick, MD                                                                                                                                    Past Surgical History History reviewed. No pertinent surgical history. Family History No family history on file.  Social History Social History   Tobacco Use   Smoking status: Never   Smokeless tobacco: Never  Vaping Use   Vaping Use: Never used  Substance Use Topics   Alcohol use: Never   Drug use: Never   Allergies Patient has no known allergies.  Review of Systems Review of Systems  Constitutional:  Negative for fatigue and fever.  Cardiovascular:  Negative for chest pain.  Gastrointestinal:  Positive for abdominal pain. Negative for constipation, diarrhea, flatus and nausea.  Genitourinary:  Negative for dysuria.  All other systems are reviewed and are negative for acute change except as noted in the HPI  Physical Exam Vital Signs  I have reviewed the triage vital signs BP (!) 137/94   Pulse (!) 57   Temp 98.1 F (36.7 C) (Oral)   Resp 16   Ht 6\' 2"  (1.88 m)   Wt 75.8 kg   SpO2 100%   BMI 21.44 kg/m   Physical Exam Vitals reviewed.  Constitutional:      General: He is not in acute distress.    Appearance: He is well-developed. He is not diaphoretic.   HENT:     Head: Normocephalic and atraumatic.     Right Ear: External ear normal.     Left Ear: External ear normal.     Nose: Nose normal.     Mouth/Throat:     Mouth: Mucous membranes are moist.  Eyes:     General: No scleral icterus.    Conjunctiva/sclera: Conjunctivae normal.  Neck:     Trachea: Phonation normal.  Cardiovascular:     Rate and Rhythm: Normal rate and regular rhythm.  Pulmonary:     Effort: Pulmonary effort is normal. No respiratory distress.     Breath sounds: No stridor.  Abdominal:     General: There is no distension.     Tenderness: There is no abdominal tenderness. There is no guarding or rebound.  Musculoskeletal:        General: Normal range of motion.     Cervical back: Normal range of motion.  Neurological:     Mental Status: He is alert and oriented to person, place, and time.  Psychiatric:        Behavior: Behavior normal.    ED Results and Treatments Labs (all labs ordered are listed, but only abnormal results are displayed) Labs Reviewed  COMPREHENSIVE METABOLIC PANEL - Abnormal; Notable for the following components:      Result Value   Glucose, Bld 103 (*)    All other components within normal limits  URINALYSIS, ROUTINE W REFLEX MICROSCOPIC - Abnormal; Notable for the following components:   Color, Urine STRAW (*)    All other components within normal limits  LIPASE, BLOOD  CBC WITH DIFFERENTIAL/PLATELET                                                                                                                         EKG  EKG Interpretation  Date/Time:    Ventricular Rate:    PR Interval:    QRS Duration:   QT Interval:    QTC Calculation:   R Axis:     Text Interpretation:         Radiology No results found.  Pertinent labs & imaging results that were available during my care of the patient were reviewed by me and considered in my medical decision making (see MDM for details).  Medications Ordered in  ED Medications - No data to display                                                                                                                                   Procedures Procedures  (including critical care time)  Medical Decision Making / ED Course I have reviewed the nursing notes for this encounter and the patient's prior records (if available in EHR or on provided paperwork).  KATHLEEN LIKINS was evaluated in Emergency Department on 01/10/2021 for the symptoms described in the history of present illness. He was evaluated in the context of the global COVID-19 pandemic, which necessitated consideration that the patient might be at risk for infection with the SARS-CoV-2 virus that causes COVID-19. Institutional protocols and algorithms that pertain to the evaluation of patients at risk for COVID-19 are in a state of rapid change based on information released by regulatory bodies including the CDC and federal and state organizations. These policies and algorithms were followed during the patient's care in the ED.     Several months of sporadic abdominal cramping but self resolves. No pain for the past 1 to 2 days. No associated nausea, vomiting, diarrhea. Abdomen benign. Labs drawn in triage grossly reassuring without leukocytosis or anemia.  No significant electrolyte derangements or renal sufficiency.  No evidence of bili obstruction or pancreatitis. UA without evidence of infection.  Pertinent labs & imaging results that were available during my care of the patient were reviewed by me and considered in my medical decision making:  Low suspicion for serious intra-abdominal inflammatory/infectious process or bowel obstruction requiring imaging at this time. Likely functional/colicky pain.  Final Clinical Impression(s) / ED Diagnoses Final diagnoses:  Abdominal cramping   The patient appears reasonably screened and/or stabilized for discharge and I doubt any other medical  condition or other [facility] requiring further screening, evaluation, or treatment in the ED at this time prior to discharge. Safe for discharge with strict return precautions.  Disposition: Discharge  Condition: Good  I have discussed the results, Dx and Tx plan with the patient/family who expressed understanding and agree(s) with the plan. Discharge instructions discussed at length. The patient/family was given strict return precautions who verbalized understanding of the instructions. No further questions at time of discharge.    ED Discharge Orders     None       Follow Up: Primary care provider  Call  if you do not have a  primary care physician, contact [facility] at [phone] for referral     This chart was dictated using voice recognition software.  Despite best efforts to proofread,  errors can occur which can change the documentation meaning.      Nira Connardama, Modesty Rudy Eduardo, MD 01/10/21 [phone]

## 2021-02-04 DIAGNOSIS — Z0000 Encounter for general adult medical examination without abnormal findings: Secondary | ICD-10-CM | POA: Diagnosis not present

## 2021-02-04 DIAGNOSIS — R350 Frequency of micturition: Secondary | ICD-10-CM | POA: Diagnosis not present

## 2021-02-04 DIAGNOSIS — N529 Male erectile dysfunction, unspecified: Secondary | ICD-10-CM | POA: Diagnosis not present

## 2021-02-04 DIAGNOSIS — R109 Unspecified abdominal pain: Secondary | ICD-10-CM | POA: Diagnosis not present

## 2022-04-26 IMAGING — CT CT ABD-PELV W/ CM
2 of 5 series · 16 of 46 positions shown, 18 images · IV contrast (omnipaque)
Comparison: CT abdomen pelvis 09/25/2019

CLINICAL DATA: "Pt reports lower abd pain that began last [REDACTED]
/ [REDACTED]. Pt denies n/v/d. Pt last BM was this morning.

EXAM:
CT ABDOMEN AND PELVIS WITH CONTRAST
TECHNIQUE: Multidetector CT imaging of the abdomen and pelvis was performed
using the standard protocol following bolus administration of
intravenous contrast.
CONTRAST:  100mL OMNIPAQUE IOHEXOL 300 MG/ML  SOLN

[Series 2: axial st · axial · 0.73mm/px · z∈[-480,-90]mm · 13 of 90 slices shown, 15 images]
[im 6/90  soft-tissue]
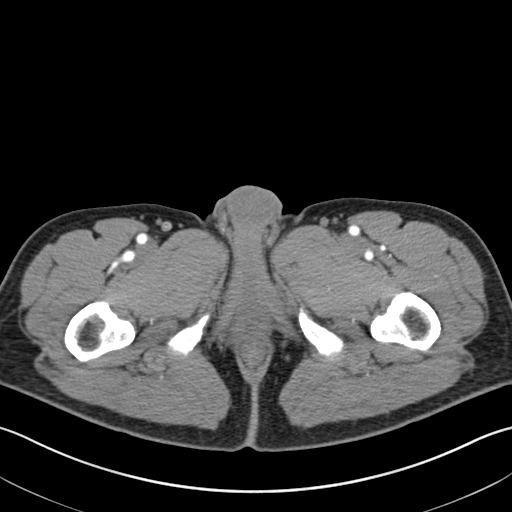
[im 6/90  bone]
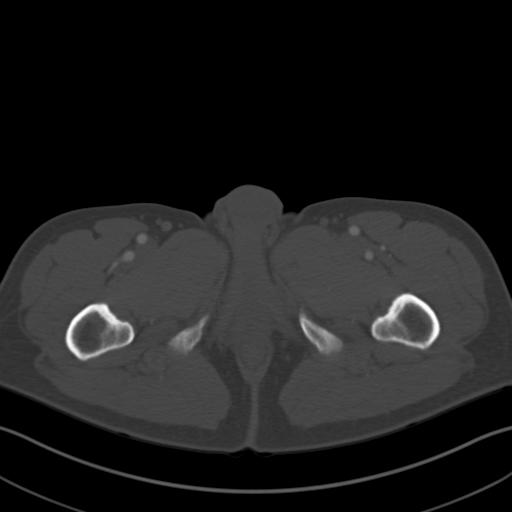
[im 12/90  soft-tissue]
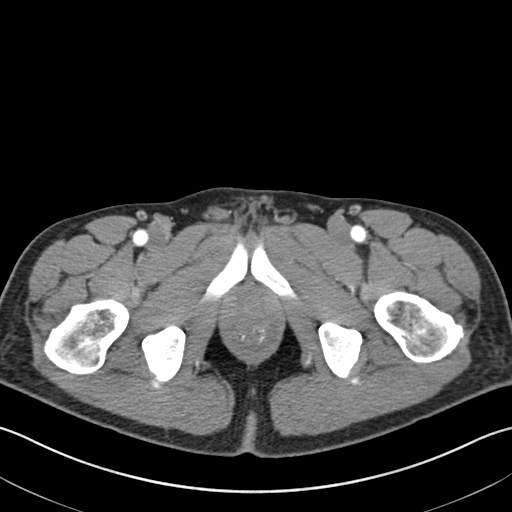
[im 18/90  soft-tissue]
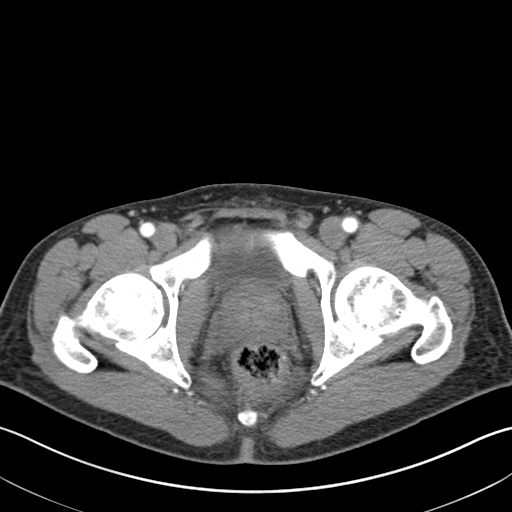
[im 24/90  soft-tissue]
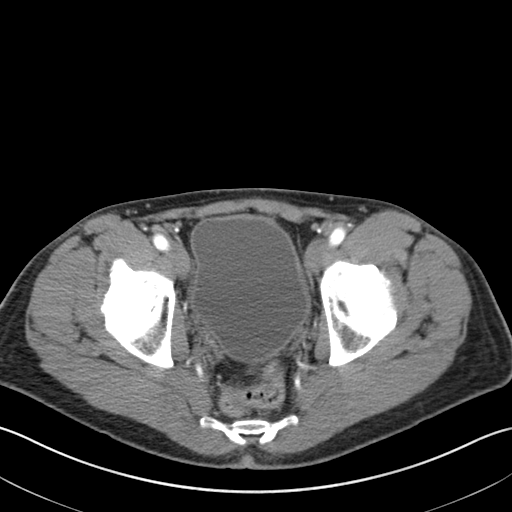
[im 30/90  soft-tissue]
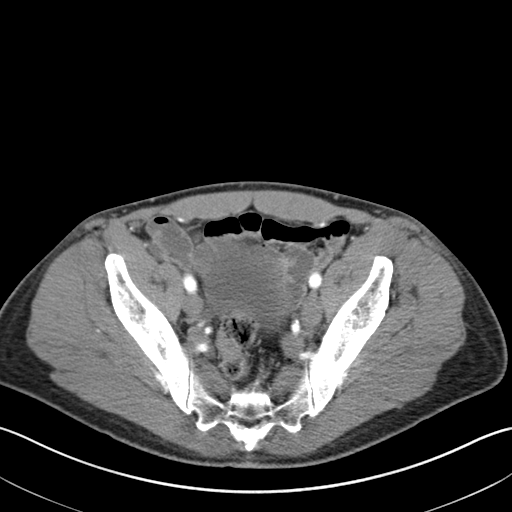
[im 36/90  soft-tissue]
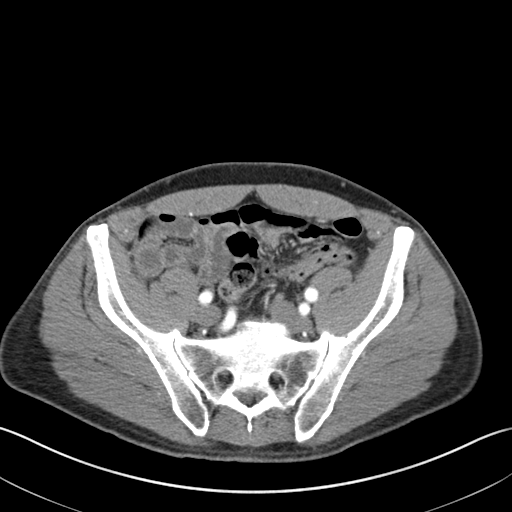
[im 48/90  soft-tissue]
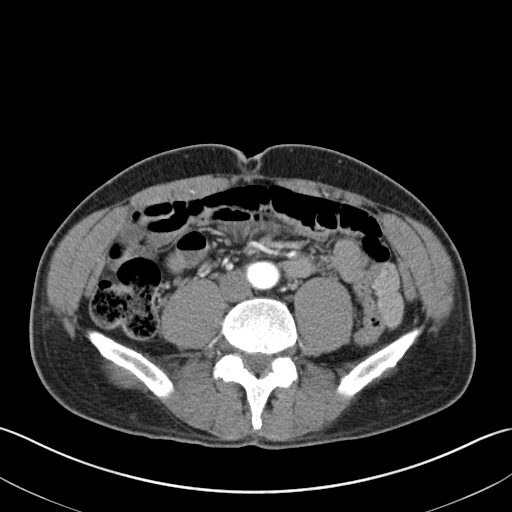
[im 54/90  soft-tissue]
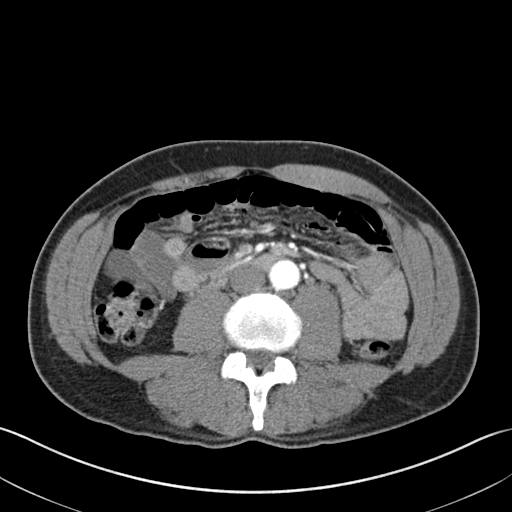
[im 60/90  soft-tissue]
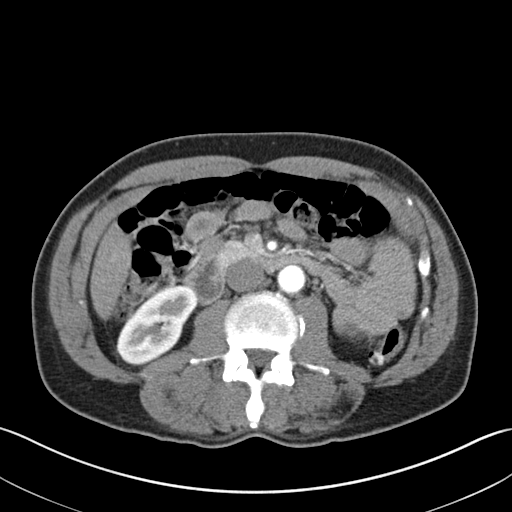
[im 60/90  bone]
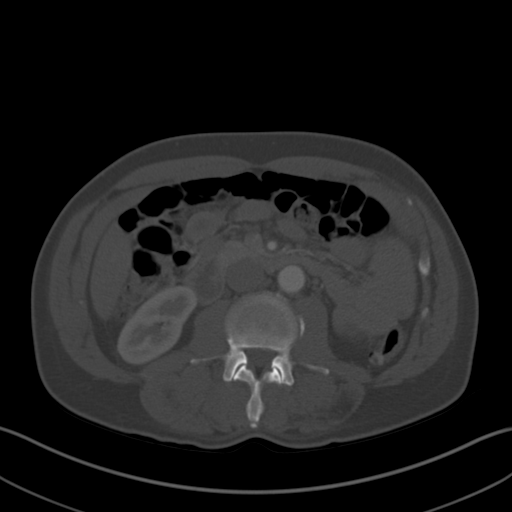
[im 66/90  soft-tissue]
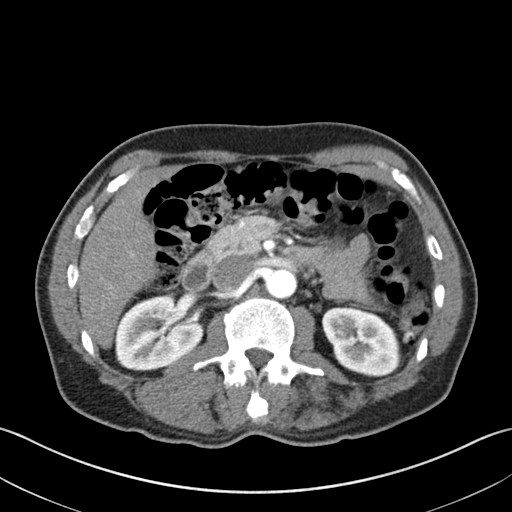
[im 72/90  soft-tissue]
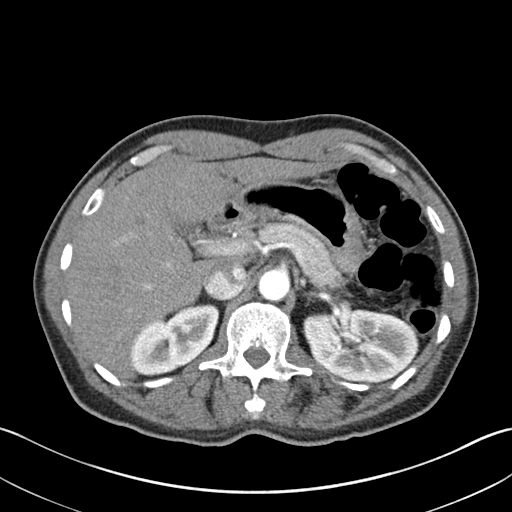
[im 78/90  soft-tissue]
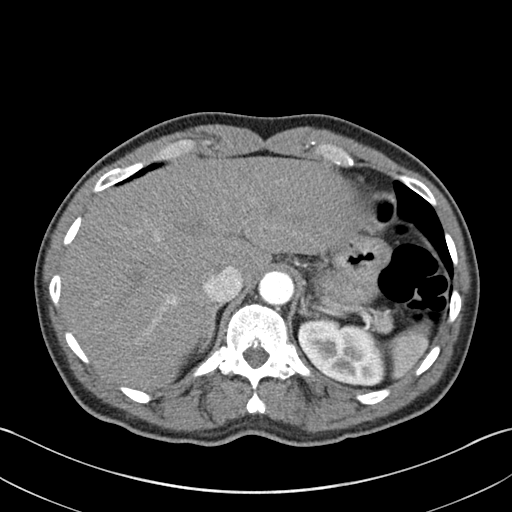
[im 84/90  soft-tissue]
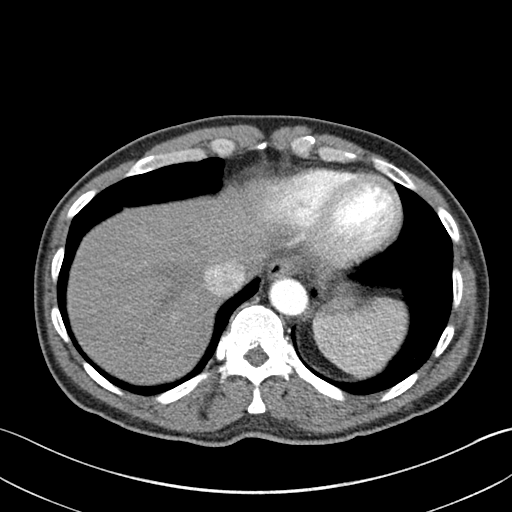

[Series 4: coronal st · coronal · 0.73mm/px · 3 of 132 slices shown]
[im 44/132  soft-tissue]
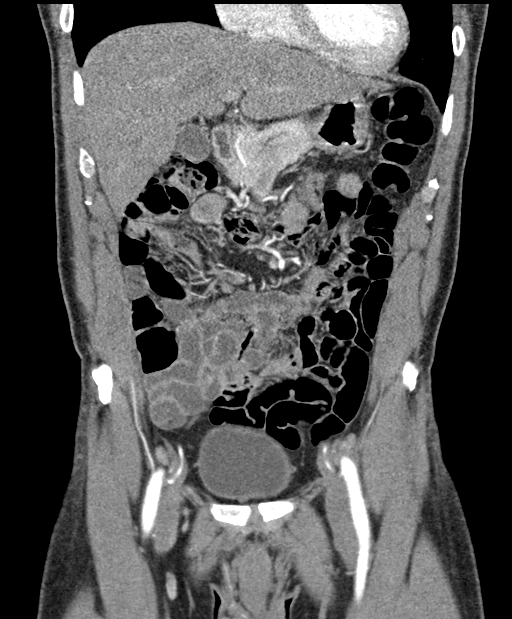
[im 59/132  soft-tissue]
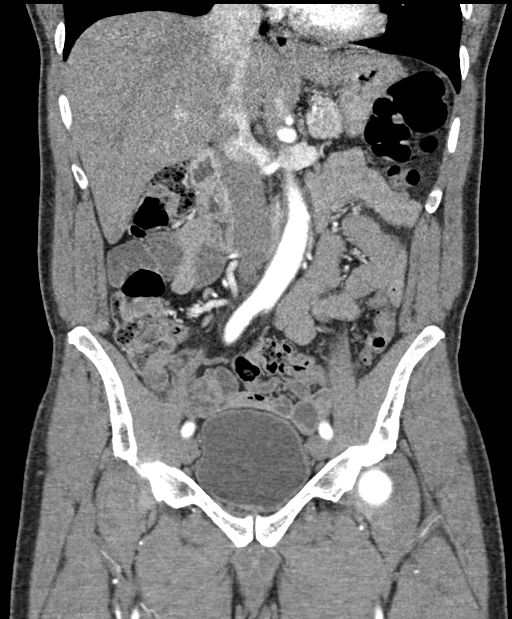
[im 73/132  soft-tissue]
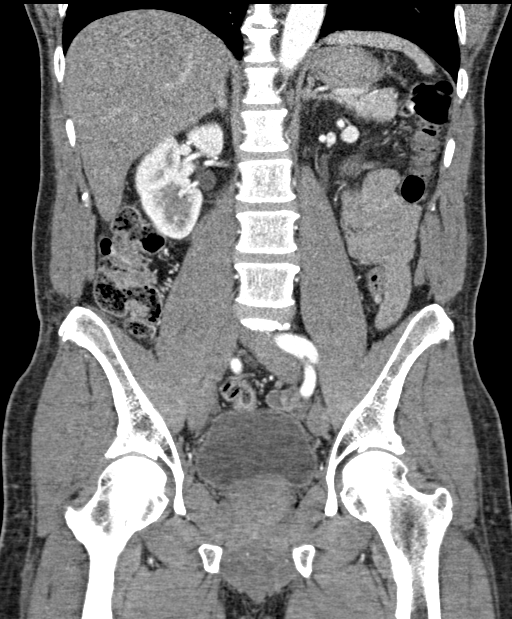

[16 of 46 positions shown; findings below may reference images not displayed]

FINDINGS: Lower chest: No acute abnormality.

Hepatobiliary: No focal liver abnormality is seen. No gallstones,
gallbladder wall thickening, or biliary dilatation.

Pancreas: Unremarkable. No pancreatic ductal dilatation or
surrounding inflammatory changes.

Spleen: Normal in size without focal abnormality.

Adrenals/Urinary Tract: No adrenal nodule bilaterally. Bilateral
kidneys enhance symmetrically. No hydronephrosis. No hydroureter.
The urinary bladder is unremarkable.

Stomach/Bowel: Stomach is within normal limits. Appendix appears
normal. No evidence of bowel wall thickening, distention, or
inflammatory changes. Scattered colonic diverticula.

Vascular/Lymphatic: No abdominal aorta or iliac aneurysm. Mild
atherosclerotic plaque of the aorta and its branches. No abdominal,
pelvic, or inguinal lymphadenopathy.

Reproductive: The prostate is enlarged measuring up to 5.5 cm.

Other: No intraperitoneal free fluid. No intraperitoneal free gas.
No organized fluid collection.

Musculoskeletal:

No abdominal wall hernia or abnormality

No suspicious lytic or blastic osseous lesions. No acute displaced
fracture. Multilevel degenerative changes of the spine.
IMPRESSION: 1. Prostatomegaly.
2. Otherwise no acute intraabdominal or intrapelvic abnormality.

## 2024-03-07 ENCOUNTER — Other Ambulatory Visit: Payer: Self-pay | Admitting: Orthopedic Surgery

## 2024-03-07 DIAGNOSIS — R0789 Other chest pain: Secondary | ICD-10-CM

## 2024-03-14 ENCOUNTER — Other Ambulatory Visit

## 2024-03-19 ENCOUNTER — Ambulatory Visit
Admission: RE | Admit: 2024-03-19 | Discharge: 2024-03-19 | Disposition: A | Discharge status: Home / Self Care | Source: Ambulatory Visit | Attending: Orthopedic Surgery | Admitting: Orthopedic Surgery

## 2024-03-19 DIAGNOSIS — R0789 Other chest pain: Secondary | ICD-10-CM

## 2024-06-30 ENCOUNTER — Ambulatory Visit: Admitting: Podiatry

## 2024-07-07 ENCOUNTER — Ambulatory Visit: Admitting: Podiatry

## 2024-07-14 ENCOUNTER — Ambulatory Visit: Admitting: Podiatry
